# Patient Record
Sex: Female | Born: 2000 | Race: White | Hispanic: No | Marital: Single | Smoking: Never smoker
Health system: Southern US, Community
[De-identification: ages and names within clinical notes are randomized; demographics above are authoritative.]

## PROBLEM LIST (undated history)

## (undated) DIAGNOSIS — F99 Mental disorder, not otherwise specified: Secondary | ICD-10-CM

## (undated) DIAGNOSIS — Z9109 Other allergy status, other than to drugs and biological substances: Secondary | ICD-10-CM

## (undated) HISTORY — DX: Mental disorder, not otherwise specified: F99

---
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
## 2021-06-17 ENCOUNTER — Other Ambulatory Visit (HOSPITAL_COMMUNITY)
Admission: RE | Admit: 2021-06-17 | Discharge: 2021-06-17 | Disposition: A | Discharge status: Home / Self Care | Payer: BC Managed Care – PPO | Source: Ambulatory Visit | Attending: Family Medicine | Admitting: Family Medicine

## 2021-06-17 ENCOUNTER — Ambulatory Visit: Payer: BC Managed Care – PPO | Admitting: Family Medicine

## 2021-06-17 ENCOUNTER — Encounter: Payer: Self-pay | Admitting: Family Medicine

## 2021-06-17 VITALS — BP 116/75 | HR 88 | Wt 118.0 lb

## 2021-06-17 DIAGNOSIS — Z113 Encounter for screening for infections with a predominantly sexual mode of transmission: Secondary | ICD-10-CM

## 2021-06-17 DIAGNOSIS — Z30431 Encounter for routine checking of intrauterine contraceptive device: Secondary | ICD-10-CM | POA: Diagnosis not present

## 2021-06-18 LAB — CERVICOVAGINAL ANCILLARY ONLY
Chlamydia: NEGATIVE
Comment: NEGATIVE
Comment: NEGATIVE
Comment: NORMAL
Neisseria Gonorrhea: NEGATIVE
Trichomonas: NEGATIVE

## 2021-06-19 ENCOUNTER — Encounter: Payer: Self-pay | Admitting: Family Medicine

## 2021-06-19 NOTE — Progress Notes (Signed)
? ? ?  GYNECOLOGY CLINIC- IUD STRING CHECK PROGRESS NOTE ? ?History:  ?21 y.o. G0P0000 here today for today for IUD string check; Mirena was placed  2022. No complaints about the Mirena, no concerning side effects. Desires STI check ? ?The following portions of the patient's history were reviewed and updated as appropriate: allergies, current medications, past family history, past medical history, past social history, past surgical history and problem list. Last pap smear on NA-- at 35 ? ?Review of Systems:  ?Pertinent items are noted in HPI. ?  ?Objective:  ?Physical Exam ?Blood pressure 116/75, pulse 88, weight 118 lb (53.5 kg). ?Gen: NAD ?Abd: Soft, nontender and nondistended ?Pelvic: Normal appearing external genitalia; normal appearing vaginal mucosa and cervix.  IUD strings visualized, about 3 cm in length outside cervix.  ? ?Assessment & Plan:  ?Normal IUD check. ?Patient to keep IUD in place for five years; can come in for removal if she desires pregnancy within the next five years. ?Routine preventative health maintenance measures emphasized ?STI check today ? ?Federico Flake, MD  ?Faculty Practice  ?Center for [employer] ? ?

## 2021-09-11 ENCOUNTER — Ambulatory Visit
Admission: RE | Admit: 2021-09-11 | Discharge: 2021-09-11 | Disposition: A | Discharge status: Home / Self Care | Payer: BC Managed Care – PPO | Source: Ambulatory Visit | Attending: Urgent Care | Admitting: Urgent Care

## 2021-09-11 VITALS — BP 104/69 | HR 64 | Temp 98.4°F | Resp 16

## 2021-09-11 DIAGNOSIS — J069 Acute upper respiratory infection, unspecified: Secondary | ICD-10-CM | POA: Insufficient documentation

## 2021-09-11 DIAGNOSIS — J029 Acute pharyngitis, unspecified: Secondary | ICD-10-CM | POA: Insufficient documentation

## 2021-09-11 LAB — POCT RAPID STREP A (OFFICE): Rapid Strep A Screen: NEGATIVE

## 2021-09-11 NOTE — ED Triage Notes (Signed)
Pt presents with a cough, HA, and ST x 2 days.

## 2021-09-11 NOTE — Discharge Instructions (Addendum)
Your sore throat is likely related to a virus. Your strep test is negative. We will call with the results of the throat swab if it requires treatment with antibiotics. We have also swabbed you for covid and flu, and will call with your results once available. Please alternate tylenol with ibuprofen to help with the discomfort or fever. You may also try chloraseptic spray or cepacol lozenges. Monitor for fever >100.3, swollen lymph nodes or white spots on the back of the throat which may indicate a need to return to our clinic.

## 2021-09-11 NOTE — ED Provider Notes (Signed)
Angel Mathews    CSN: [id number] Arrival date & time: 09/11/21  0854      History   Chief Complaint Chief Complaint  Patient presents with   Sore Throat    With a cough, slight fever, and headaches - Entered by patient   Cough   Headache    HPI Angel Mathews is a 21 y.o. female.   Pleasant 21yo female presents today with complaint of a cough, sore throat and headache x 2 days (today is day 3). She also reports having hot and cold chills, but has not checked temp with a thermometer, although feels febrile. Cough is dry. Reports headache to frontal and occipital region, denies nuchal rigidity or lymphadenopathy. Denies sinus pain, congestion, rhinorrhea, ear pain. Recently returned from a trip to [place], is concerned she picked something up at her 9 hour layover at the airport. Does have decreased appetite, but denies n/v/d, abdominal pain or rash. She did take a home covid test 24 hours after symptom onset which was reportedly negative.   Sore Throat Associated symptoms include headaches. Pertinent negatives include no abdominal pain and no shortness of breath.  Cough Associated symptoms: chills, fever (subjective), headaches and sore throat   Associated symptoms: no diaphoresis, no ear pain, no rhinorrhea and no shortness of breath   Headache Associated symptoms: cough, fever (subjective) and sore throat   Associated symptoms: no abdominal pain, no congestion, no diarrhea, no ear pain, no nausea, no sinus pressure and no vomiting     Past Medical History:  Diagnosis Date   Environmental allergies    Mental disorder    depression and anxiety    There are no problems to display for this patient.   History reviewed. No pertinent surgical history.  OB History     Gravida  0   Para  0   Term  0   Preterm  0   AB  0   Living  0      SAB  0   IAB  0   Ectopic  0   Multiple  0   Live Births  0            Home Medications    Prior to  Admission medications   Medication Sig Start Date End Date Taking? Authorizing Provider  levonorgestrel (MIRENA) 20 MCG/DAY IUD 1 each by Intrauterine route once.    [provider]    Family History History reviewed. No pertinent family history.  Social History Social History   Tobacco Use   Smoking status: Never   Smokeless tobacco: Never  Vaping Use   Vaping Use: Never used  Substance Use Topics   Alcohol use: Never   Drug use: Never     Allergies   Patient has no known allergies.   Review of Systems Review of Systems  Constitutional:  Positive for appetite change, chills and fever (subjective). Negative for diaphoresis.  HENT:  Positive for sore throat. Negative for congestion, ear discharge, ear pain, rhinorrhea, sinus pressure and sneezing.   Respiratory:  Positive for cough. Negative for chest tightness and shortness of breath.   Gastrointestinal:  Negative for abdominal pain, diarrhea, nausea and vomiting.  Neurological:  Positive for headaches.  All other systems reviewed and are negative.    Physical Exam Triage Vital Signs ED Triage Vitals [09/11/21 0907]  Enc Vitals Group     BP 104/69     Pulse Rate 64     Resp 16  Temp 98.4 F (36.9 C)     Temp Source Oral     SpO2 99 %     Weight      Height      Head Circumference      Peak Flow      Pain Score      Pain Loc      Pain Edu?      Excl. in GC?    No data found.  Updated Vital Signs BP 104/69 (BP Location: Left Arm)   Pulse 64   Temp 98.4 F (36.9 C) (Oral)   Resp 16   SpO2 99%   Visual Acuity Right Eye Distance:   Left Eye Distance:   Bilateral Distance:    Right Eye Near:   Left Eye Near:    Bilateral Near:     Physical Exam Vitals and nursing note reviewed.  Constitutional:      General: She is not in acute distress.    Appearance: She is well-developed and normal weight. She is not ill-appearing, toxic-appearing or diaphoretic.  HENT:     Head: Normocephalic  and atraumatic.     Right Ear: Tympanic membrane and ear canal normal. No drainage, swelling or tenderness. No middle ear effusion. Tympanic membrane is not erythematous.     Left Ear: Tympanic membrane and ear canal normal. No drainage, swelling or tenderness.  No middle ear effusion. Tympanic membrane is not erythematous.     Nose: No congestion or rhinorrhea.     Mouth/Throat:     Mouth: Mucous membranes are moist. No oral lesions.     Pharynx: Oropharynx is clear. Uvula midline. Posterior oropharyngeal erythema (soft palate only, bilaterally) present. No pharyngeal swelling, oropharyngeal exudate or uvula swelling.     Tonsils: No tonsillar exudate or tonsillar abscesses.  Eyes:     Extraocular Movements:     Right eye: Normal extraocular motion.     Left eye: Normal extraocular motion.     Conjunctiva/sclera: Conjunctivae normal.     Pupils: Pupils are equal, round, and reactive to light.  Neck:     Thyroid: No thyromegaly.  Cardiovascular:     Rate and Rhythm: Normal rate and regular rhythm.     Heart sounds: Normal heart sounds. No murmur heard.    No friction rub. No gallop.  Pulmonary:     Effort: Pulmonary effort is normal. No respiratory distress.     Breath sounds: Normal breath sounds. No stridor. No wheezing, rhonchi or rales.  Chest:     Chest wall: No tenderness.  Abdominal:     Palpations: Abdomen is soft.  Musculoskeletal:     Cervical back: Normal range of motion and neck supple.  Lymphadenopathy:     Cervical: Cervical adenopathy (B submandibular) present.  Skin:    General: Skin is warm and dry.     Coloration: Skin is not pale.     Findings: No erythema or rash.  Neurological:     General: No focal deficit present.     Mental Status: She is alert and oriented to person, place, and time.  Psychiatric:        Mood and Affect: Mood normal.        Behavior: Behavior normal.      UC Treatments / Results  Labs (all labs ordered are listed, but only  abnormal results are displayed) Labs Reviewed  CULTURE, GROUP A STREP ([facility])  COVID-19, FLU A+B NAA  POCT RAPID STREP A (OFFICE)  EKG   Radiology No results found.  Procedures Procedures (including critical care time)  Medications Ordered in UC Medications - No data to display  Initial Impression / Assessment and Plan / UC Course  I have reviewed the triage vital signs and the nursing notes.  Pertinent labs & imaging results that were available during my care of the patient were reviewed by me and considered in my medical decision making (see chart for details).     Acute pharyngitis - rapid strep negative. Throat culture obtained. Supportive management with OTC care appropriate. Recommendations discussed Viral URI - covid/ flu testing performed given pt's recent international travel. Pt is healthy without comorbidities and would not be a candidate for anti-viral therapy if positive.    Final Clinical Impressions(s) / UC Diagnoses   Final diagnoses:  Acute pharyngitis, unspecified etiology  Viral upper respiratory infection     Discharge Instructions      Your sore throat is likely related to a virus. Your strep test is negative. We will call with the results of the throat swab if it requires treatment with antibiotics. We have also swabbed you for covid and flu, and will call with your results once available. Please alternate tylenol with ibuprofen to help with the discomfort or fever. You may also try chloraseptic spray or cepacol lozenges. Monitor for fever >100.3, swollen lymph nodes or white spots on the back of the throat which may indicate a need to return to our clinic.      ED Prescriptions   None    PDMP not reviewed this encounter.   Maretta Bees, [place] 09/11/21 [phone]

## 2021-09-12 LAB — COVID-19, FLU A+B NAA
Influenza A, NAA: NOT DETECTED
Influenza B, NAA: NOT DETECTED
SARS-CoV-2, NAA: NOT DETECTED

## 2021-09-12 LAB — CULTURE, GROUP A STREP (THRC)

## 2021-09-14 LAB — CULTURE, GROUP A STREP (THRC)

## 2022-04-12 ENCOUNTER — Ambulatory Visit
Admission: RE | Admit: 2022-04-12 | Discharge: 2022-04-12 | Disposition: A | Discharge status: Home / Self Care | Payer: BC Managed Care – PPO | Source: Ambulatory Visit | Attending: Nurse Practitioner | Admitting: Nurse Practitioner

## 2022-04-12 ENCOUNTER — Ambulatory Visit
Admission: RE | Admit: 2022-04-12 | Discharge: 2022-04-12 | Disposition: A | Discharge status: Home / Self Care | Payer: BC Managed Care – PPO | Attending: Nurse Practitioner | Admitting: Nurse Practitioner

## 2022-04-12 ENCOUNTER — Other Ambulatory Visit: Payer: Self-pay | Admitting: Nurse Practitioner

## 2022-04-12 DIAGNOSIS — M79641 Pain in right hand: Secondary | ICD-10-CM

## 2022-04-30 DIAGNOSIS — F329 Major depressive disorder, single episode, unspecified: Secondary | ICD-10-CM | POA: Insufficient documentation

## 2022-06-08 DIAGNOSIS — E559 Vitamin D deficiency, unspecified: Secondary | ICD-10-CM | POA: Insufficient documentation

## 2022-08-19 NOTE — Therapy (Deleted)
OUTPATIENT PHYSICAL THERAPY EVALUATION   Patient Name: Angel Mathews MRN: [id number] DOB:Jun 02, 2000, 22 y.o., female Today's Date: 08/19/2022  END OF SESSION:   Past Medical History:  Diagnosis Date   Environmental allergies    Mental disorder    depression and anxiety   No past surgical history on file. There are no problems to display for this patient.   PCP: Charlton Amor, MD  REFERRING PROVIDER: Mariana Arn, NP  REFERRING DIAG: low back pain  Rationale for Evaluation and Treatment: Rehabilitation  THERAPY DIAG:  No diagnosis found.  ONSET DATE: ***  SUBJECTIVE:                                                                                                                                                                                           SUBJECTIVE STATEMENT: ***  PERTINENT HISTORY:  Patient is a 22 y.o. female who presents to outpatient physical therapy with a referral for medical diagnosis low back pain. This patient's chief complaints consist of ***, leading to the following functional deficits: ***. Relevant past medical history and comorbidities include ***.  Patient denies hx of {redflags:27294}   PAIN:  Are you having pain? Yes: NPRS scale: Current: ***/10,  Best: ***/10, Worst: ***/10. Pain location: *** Pain description: *** Aggravating factors: *** Relieving factors: ***   FUNCTIONAL LIMITATIONS: ***  LEISURE: ***  PRECAUTIONS: {Therapy precautions:24002}  WEIGHT BEARING RESTRICTIONS: {Yes ***/No:24003}  FALLS:  Has patient fallen in last 6 months? {fallsyesno:27318}  LIVING ENVIRONMENT: Lives with: {OPRC lives with:25569::"lives with their family"} Lives in: {Lives in:25570} Stairs: {opstairs:27293} Has following equipment at home: {Assistive devices:23999}  OCCUPATION: ***  PLOF: {PLOF:24004}  PATIENT GOALS: ***  NEXT MD VISIT: ***   OBJECTIVE  DIAGNOSTIC FINDINGS:  No imaging in chart  SELF- REPORTED  FUNCTION FOTO score: ***/100 (lumbar spine questionnaire)  OBSERVATION/INSPECTION Posture Posture (seated): forward head, rounded shoulders, slumped in sitting.  Posture (standing): *** Posture correction: *** Anthropometrics Tremor: none Body composition: *** Muscle bulk: *** Skin: The incision sites appear to be healing well with no excessive redness, warmth, drainage or signs of infection present.  *** Edema: *** Functional Mobility Bed mobility: *** Transfers: *** Gait: grossly WFL for household and short community ambulation. More detailed gait analysis deferred to later date as needed. *** Stairs: ***  SPINE MOTION  LUMBAR SPINE AROM *Indicates pain Flexion: *** Extension: *** Side Flexion:   R ***  L *** Rotation:  R *** L *** Side glide:  R *** L ***   NEUROLOGICAL  Upper Motor Neuron Screen Babinski, Hoffman's and Clonus (ankle) negative bilaterally.  Dermatomes C2-T1 appears  equal and intact to light touch except the following: *** L2-S2 appears equal and intact to light touch except the following: *** Deep Tendon Reflexes R/L  ***+/***+ Biceps brachii reflex (C5, C6) ***+/***+ Brachioradialis reflex (C6) ***+/***+ Triceps brachii reflex (C7) ***+/***+ Quadriceps reflex (L4) ***+/***+ Achilles reflex (S1)  SPINE MOTION  CERVICAL SPINE AROM *Indicates pain Flexion: *** Extension: *** Side Flexion:   R ***  L *** Rotation:  R *** L ***   PERIPHERAL JOINT MOTION (in degrees)  ACTIVE RANGE OF MOTION (AROM) *Indicates pain Date Date Date  Joint/Motion R/L R/L R/L  Shoulder     Flexion / / /  Extension / / /  Abduction  / / /  External rotation / / /  Internal rotation / / /  Elbow     Flexion  / / /  Extension  / / /  Wrist     Flexion / / /  Extension  / / /  Radial deviation / / /  Ulnar deviation / / /  Pronation / / /  Supination / / /  Hip     Flexion / / /  Extension  / / /  Abduction / / /  Adduction / / /   External rotation / / /  Internal rotation  / / /  Knee     Extension / / /  Flexoin / / /  Ankle/Foot     Dorsiflexion (knee ext) / / /  Dorsiflexion (knee flex) / / /  Plantarflexion / / /  Everison / / /  Inversion / / /  Great toe extension / / /  Great toe flexion / / /  Comments:   PASSIVE RANGE OF MOTION (PROM) *Indicates pain Date Date Date  Joint/Motion R/L R/L R/L  Shoulder     Flexion / / /  Extension / / /  Abduction  / / /  External rotation / / /  Internal rotation / / /  Elbow     Flexion  / / /  Extension  / / /  Wrist     Flexion / / /  Extension  / / /  Radial deviation / / /  Ulnar deviation / / /  Pronation / / /  Supination / / /  Hip     Flexion  / / /  Extension  / / /  Abduction / / /  Adduction / / /  External rotation / / /  Internal rotation  / / /  Knee     Extension / / /  Flexion / / /  Ankle/Foot     Dorsiflexion (knee ext) / / /  Dorsiflexion (knee flex) / / /  Plantarflexion / / /  Everison / / /  Inversion / / /  Great toe extension / / /  Great toe flexion / / /  Comments:   MUSCLE PERFORMANCE (MMT):  *Indicates pain Date Date Date  Joint/Motion R/L R/L R/L  Shoulder     Flexion / / /  Abduction (C5) / / /  External rotation / / /  Internal rotation / / /  Extension / / /  Elbow     Flexion (C6) / / /  Extension (C7) / / /  Wrist     Flexion (C7) / / /  Extension (C6) / / /  Radial deviation / / /  Ulnar deviation (C8) / / /  Pronation / / /  Supination / / /  Hand     Thumb extension (C8) / / /  Finger abduction (T1) / / /  Grip (C8) / / /  Hip     Flexion (L1, L2) / / /  Extension (knee ext) / / /  Extension (knee flex) / / /  Abduction / / /  Adduction / / /  External rotation / / /  Internal rotation  / / /  Knee     Extension (L3) / / /  Flexion (S2) / / /  Ankle/Foot     Dorsiflexion (L4) / / /  Great toe extension (L5) / / /  Eversion (S1) / / /  Plantarflexion (S1) / / /   Inversion / / /  Pronation / / /  Great toe flexion / / /  Comments:   SPECIAL TESTS:  .Neurodynamictests .NeurodynamicUE .NeurodynamicLE .CspineInstability .CSPINESPECIALTESTS .SHOULDERSPECIALTESTCLUSTERS .HIPSPECIALTESTS .SIJSPECIALTESTS   SHOULDER SPECIAL TESTS RTC, Impingement, Anterior Instability (macrotrauma), Labral Tear: Painful arc test: R = ***, L = ***. Drop arm test: R = ***, L = ***. Hawkins-Kennedy test: R = ***, L = ***. Infraspinatus test: R = ***, L = ***. Apprehension test: R = ***, L = ***. Relocation test: R = ***, L = ***. Active compression test: R = ***, L = ***.  ACCESSORY MOTION: ***  PALPATION: ***  SUSTAINED POSITIONS TESTING:  ***  REPEATED MOTIONS TESTING: ***  FUNCTIONAL/BALANCE TESTS: Five Time Sit to Stand (5TSTS): *** seconds Functional Gait Assessment (FGA): ***/30 (see details above) Ten meter walking trial ( ): *** m/s Six Minute Walk Test ( ): *** feet Timed Up and Go (TUG): *** seconds   Dynamic Gait Index: ***/24 BERG Balance Scale: ***/56 Tinetti/POMA: ***/28 Timed Up and GO: *** seconds (average of 3 trials) Trial 1: *** Trial 2: *** Trial 3: *** Romberg test: -Narrow stance, eyes open: *** seconds -Narrow stance, eyes closed: *** seconds Sharpened Romberg test: -Tandem stance, eyes open: *** seconds -Tandem stance, eyes closed: *** seconds  Narrow stance, firm surface, eyes open: *** seconds Narrow stance, firm surface, eyes closed: *** seconds Narrow stance, compliant surface, eyes open: *** seconds Narrow stance, compliant surface, eyes closed: *** seconds Single leg stance, firm surface, eyes open: R= *** seconds, L= *** seconds Single leg stance, compliant surface, eyes open: R= *** seconds, L= *** seconds Gait speed: *** m/s Functional reach test: *** inches   TODAY'S TREATMENT:    PATIENT EDUCATION:  Education details: *** Person educated: {Person educated:25204} Education method:  {Education Method:25205} Education comprehension: {Education Comprehension:25206}  HOME EXERCISE PROGRAM: ***  ASSESSMENT:  CLINICAL IMPRESSION: Patient is a 22 y.o. female referred to outpatient physical therapy with a medical diagnosis of low back pain who presents with signs and symptoms consistent with ***. Patient presents with significant *** impairments that are limiting ability to complete *** without difficulty. Patient will benefit from skilled physical therapy intervention to address current body structure impairments and activity limitations to improve function and work towards goals set in current POC in order to return to prior level of function or maximal functional improvement.    OBJECTIVE IMPAIRMENTS: {opptimpairments:25111}.   ACTIVITY LIMITATIONS: {activitylimitations:27494}  PARTICIPATION LIMITATIONS: {participationrestrictions:25113}  PERSONAL FACTORS: {Personal factors:25162} are also affecting patient's functional outcome.   REHAB POTENTIAL: {rehabpotential:25112}  CLINICAL DECISION MAKING: {clinical decision making:25114}  EVALUATION COMPLEXITY: {Evaluation complexity:25115}   GOALS: Goals reviewed with patient? {yes/no:20286}  SHORT TERM GOALS: Target date: 09/02/2022  Patient will be independent with initial home exercise program for self-management of symptoms. Baseline: {HEPbaseline4:27310} (08/19/22); Goal status: INITIAL   LONG TERM GOALS: Target date: 11/11/2022  Patient will be independent with a long-term home exercise program for self-management of symptoms.  Baseline: {HEPbaseline4:27310} (08/19/22); Goal status: INITIAL  2.  Patient will demonstrate improved FOTO to equal or greater than *** by visit #*** to demonstrate improvement in overall condition and self-reported functional ability.  Baseline: *** (08/19/22); Goal status: INITIAL  3.  *** Baseline: *** (08/19/22); Goal status: INITIAL  4.  *** Baseline: *** (08/19/22); Goal  status: INITIAL  5.  Patient will complete community, work and/or recreational activities without limitation due to current condition.  Baseline: *** (08/19/22); Goal status: INITIAL  6.  *** Baseline: *** Goal status: INITIAL   PLAN:  PT FREQUENCY: {rehab frequency:25116}  PT DURATION: {rehab duration:25117}  PLANNED INTERVENTIONS: {rehab planned interventions:25118::"Therapeutic exercises","Therapeutic activity","Neuromuscular re-education","Balance training","Gait training","Patient/Family education","Self Care","Joint mobilization"}.  PLAN FOR NEXT SESSION: ***   Cira Rue, PT, DPT 08/19/2022, 12:54 PM   [facility] [address] [place], [place] [address] P: [phone] I F: [phone]

## 2022-08-26 ENCOUNTER — Ambulatory Visit: Payer: Self-pay | Attending: Nurse Practitioner | Admitting: Physical Therapy

## 2022-08-31 ENCOUNTER — Encounter: Payer: Self-pay | Admitting: Physical Therapy

## 2022-09-02 ENCOUNTER — Encounter: Payer: Self-pay | Admitting: Physical Therapy

## 2022-09-07 ENCOUNTER — Encounter: Payer: Self-pay | Admitting: Physical Therapy

## 2022-09-10 ENCOUNTER — Ambulatory Visit
Admission: RE | Admit: 2022-09-10 | Discharge: 2022-09-10 | Disposition: A | Discharge status: Home / Self Care | Payer: Self-pay | Source: Ambulatory Visit | Attending: Nurse Practitioner | Admitting: Nurse Practitioner

## 2022-09-10 ENCOUNTER — Ambulatory Visit
Admission: RE | Admit: 2022-09-10 | Discharge: 2022-09-10 | Disposition: A | Discharge status: Home / Self Care | Payer: Self-pay | Attending: Nurse Practitioner | Admitting: Nurse Practitioner

## 2022-09-10 ENCOUNTER — Other Ambulatory Visit: Payer: Self-pay | Admitting: Nurse Practitioner

## 2022-09-10 DIAGNOSIS — M545 Low back pain, unspecified: Secondary | ICD-10-CM | POA: Insufficient documentation

## 2022-09-14 ENCOUNTER — Encounter: Payer: Self-pay | Admitting: Physical Therapy

## 2022-09-16 ENCOUNTER — Encounter: Payer: Self-pay | Admitting: Physical Therapy

## 2022-09-21 ENCOUNTER — Encounter: Payer: Self-pay | Admitting: Physical Therapy

## 2022-09-23 ENCOUNTER — Encounter: Payer: Self-pay | Admitting: Physical Therapy

## 2022-09-28 ENCOUNTER — Encounter: Payer: Self-pay | Admitting: Physical Therapy

## 2022-09-30 ENCOUNTER — Encounter: Payer: Self-pay | Admitting: Physical Therapy

## 2022-10-05 ENCOUNTER — Encounter: Payer: Self-pay | Admitting: Physical Therapy

## 2022-10-07 ENCOUNTER — Encounter: Payer: Self-pay | Admitting: Physical Therapy

## 2022-10-12 ENCOUNTER — Encounter: Payer: Self-pay | Admitting: Physical Therapy

## 2022-10-14 ENCOUNTER — Encounter: Payer: Self-pay | Admitting: Physical Therapy

## 2022-10-19 ENCOUNTER — Encounter: Payer: Self-pay | Admitting: Physical Therapy

## 2022-10-21 ENCOUNTER — Encounter: Payer: Self-pay | Admitting: Physical Therapy

## 2022-10-26 ENCOUNTER — Encounter: Payer: Self-pay | Admitting: Physical Therapy

## 2022-10-28 ENCOUNTER — Encounter: Payer: Self-pay | Admitting: Physical Therapy

## 2022-11-02 ENCOUNTER — Encounter: Payer: Self-pay | Admitting: Physical Therapy

## 2022-11-04 ENCOUNTER — Encounter: Payer: Self-pay | Admitting: Physical Therapy

## 2022-11-09 ENCOUNTER — Encounter: Payer: Self-pay | Admitting: Physical Therapy

## 2022-11-11 ENCOUNTER — Encounter: Payer: Self-pay | Admitting: Physical Therapy

## 2023-01-17 DIAGNOSIS — F419 Anxiety disorder, unspecified: Secondary | ICD-10-CM | POA: Insufficient documentation

## 2023-03-22 ENCOUNTER — Other Ambulatory Visit: Payer: Self-pay | Admitting: Nurse Practitioner

## 2023-03-22 DIAGNOSIS — R102 Pelvic and perineal pain: Secondary | ICD-10-CM

## 2023-03-28 ENCOUNTER — Ambulatory Visit
Admission: RE | Admit: 2023-03-28 | Discharge: 2023-03-28 | Disposition: A | Discharge status: Home / Self Care | Payer: Self-pay | Source: Ambulatory Visit | Attending: Nurse Practitioner | Admitting: Nurse Practitioner

## 2023-03-28 DIAGNOSIS — R102 Pelvic and perineal pain: Secondary | ICD-10-CM | POA: Insufficient documentation

## 2023-03-30 ENCOUNTER — Ambulatory Visit (HOSPITAL_BASED_OUTPATIENT_CLINIC_OR_DEPARTMENT_OTHER): Payer: Self-pay | Admitting: Student

## 2023-03-30 ENCOUNTER — Encounter (HOSPITAL_BASED_OUTPATIENT_CLINIC_OR_DEPARTMENT_OTHER): Payer: Self-pay

## 2023-04-01 ENCOUNTER — Ambulatory Visit: Payer: Self-pay | Admitting: Family Medicine

## 2023-04-01 ENCOUNTER — Other Ambulatory Visit: Payer: Self-pay

## 2023-04-28 IMAGING — US US PELVIS COMPLETE
1 series · 15 of 25 positions shown · non-contrast
Comparison: Prior ultrasound from 08/04/2016.

CLINICAL DATA: Initial evaluation for left greater than right
pelvic pain.

EXAM:
TRANSABDOMINAL ULTRASOUND OF PELVIS
TECHNIQUE: Transabdominal ultrasound examination of the pelvis was performed
including evaluation of the uterus, ovaries, adnexal regions, and
pelvic cul-de-sac.

[Series 1: us pelvis complete · 70 acquisitions, 15 frames shown]
[im 1/70]
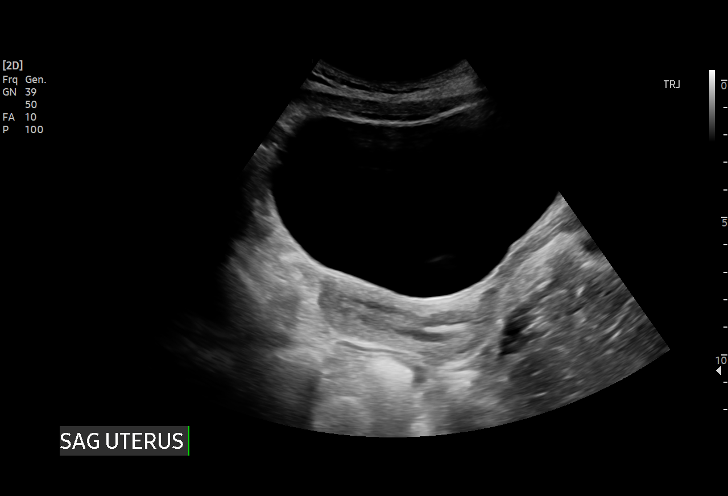
[im 6/70]
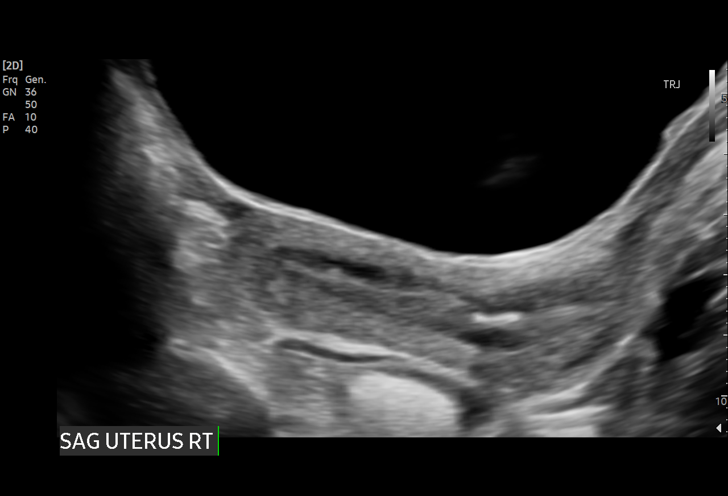
[im 12/70]
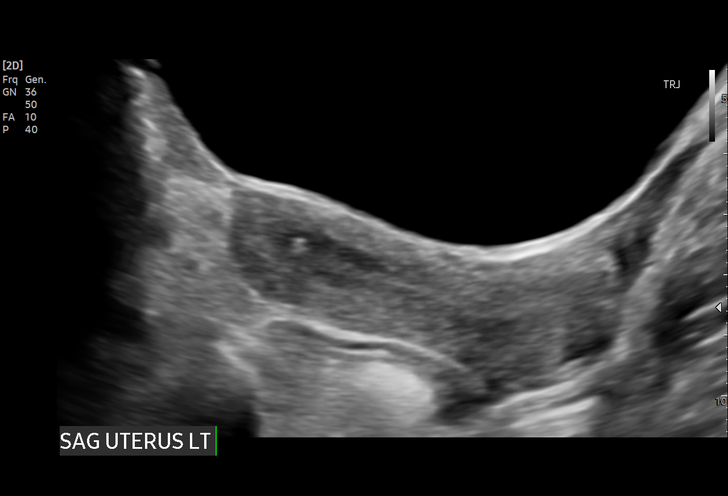
[im 15/70]
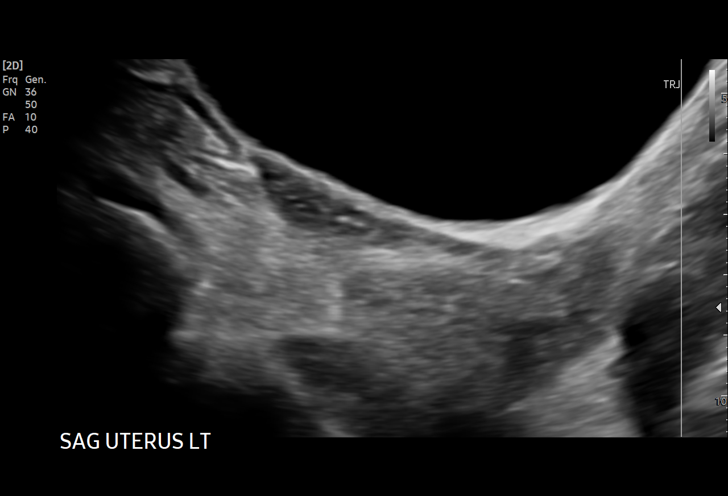
[im 21/70]
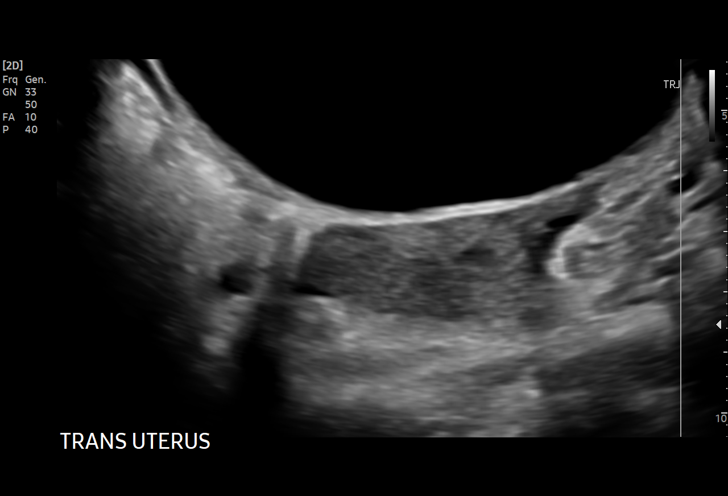
[im 26/70]
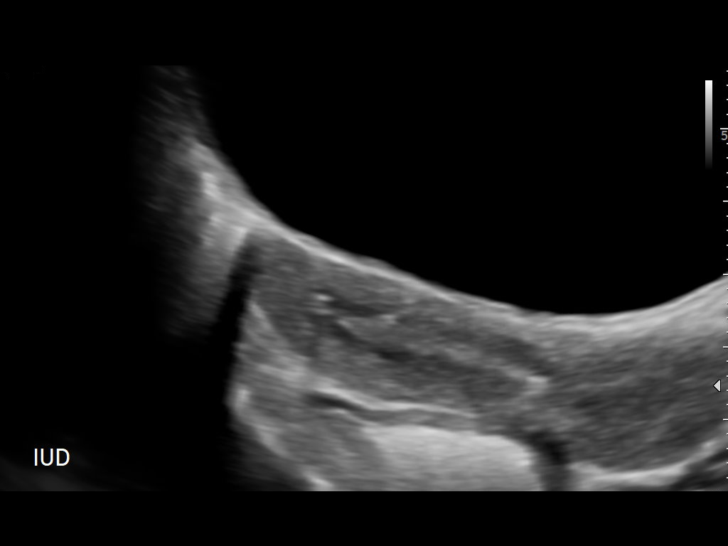
[im 29/70]
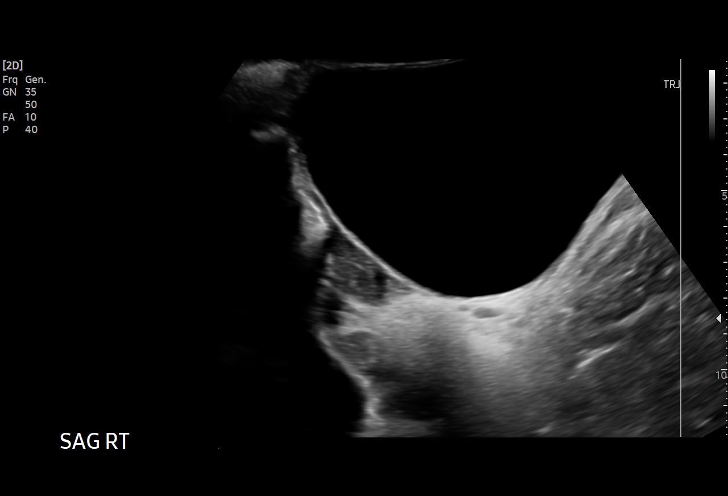
[im 35/70]
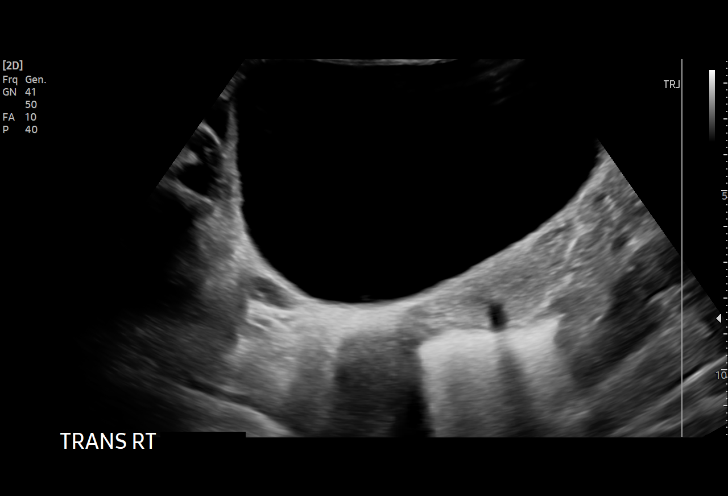
[im 41/70]
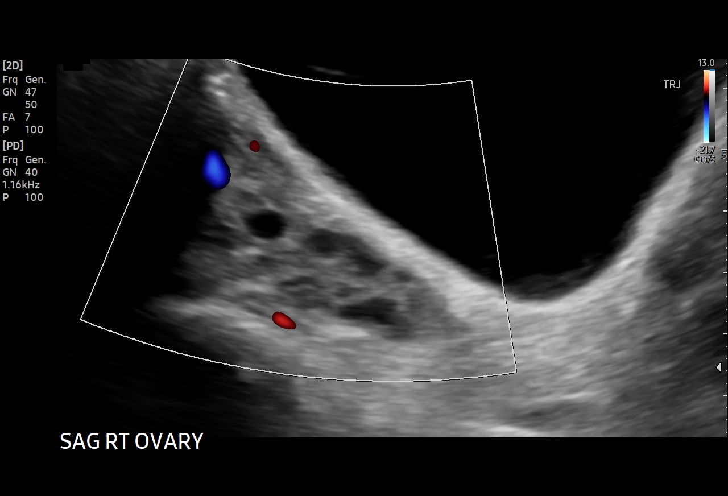
[im 44/70]
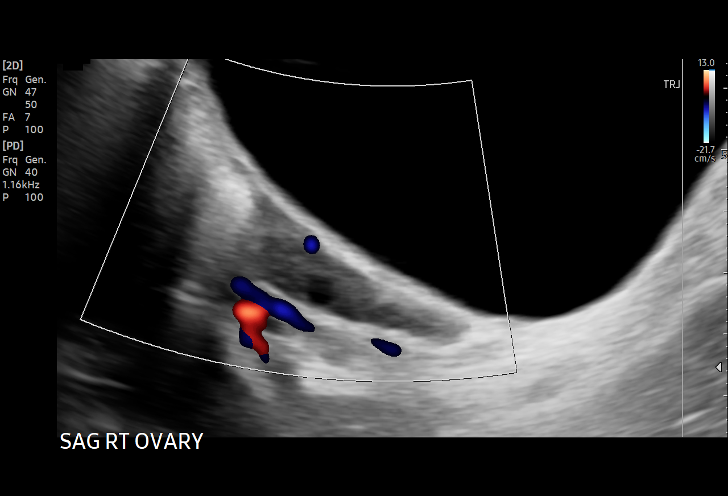
[im 49/70]
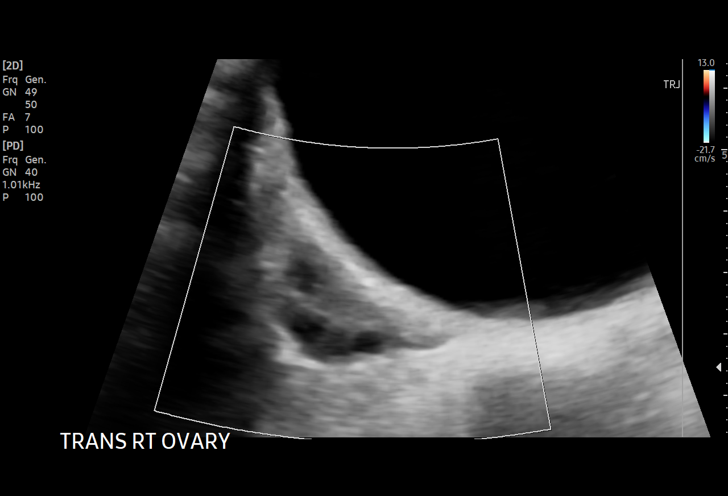
[im 55/70]
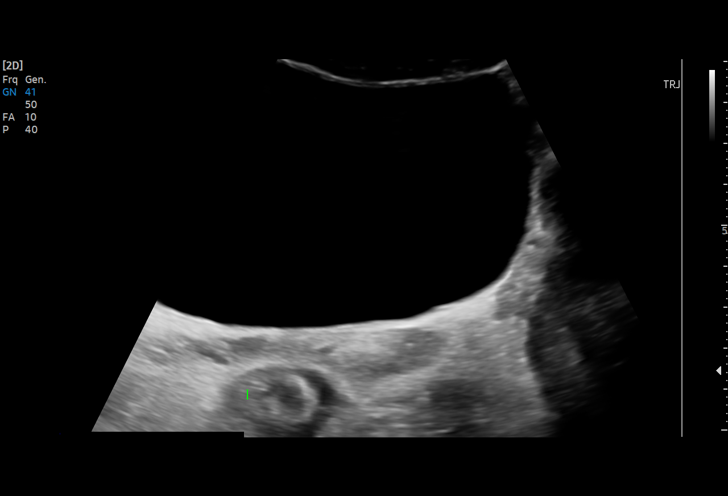
[im 58/70]
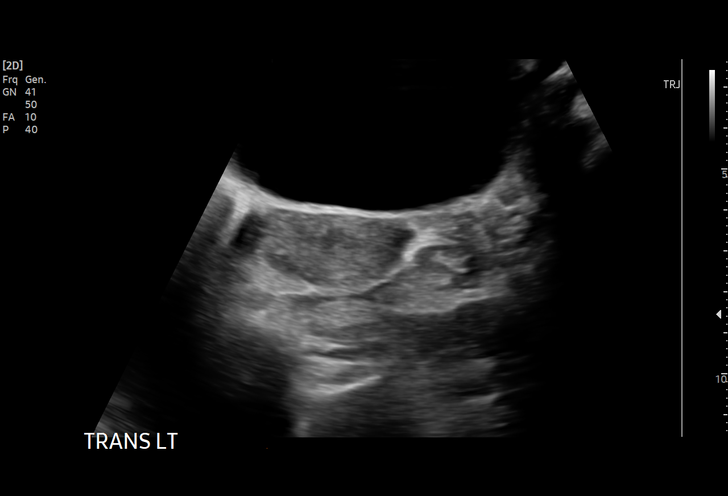
[im 64/70]
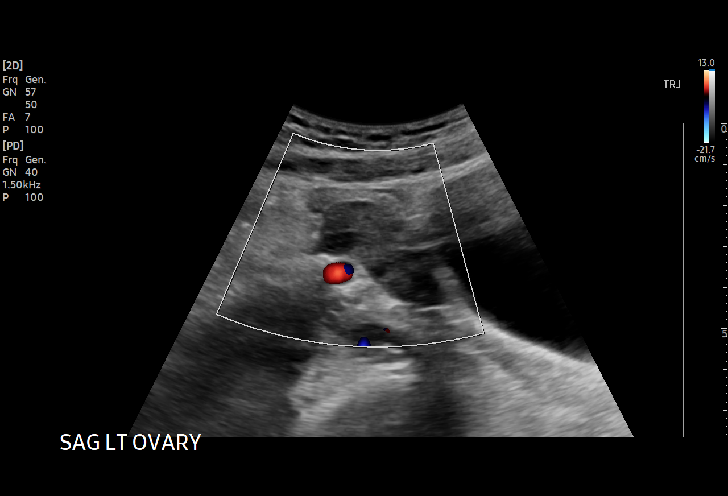
[im 70/70]
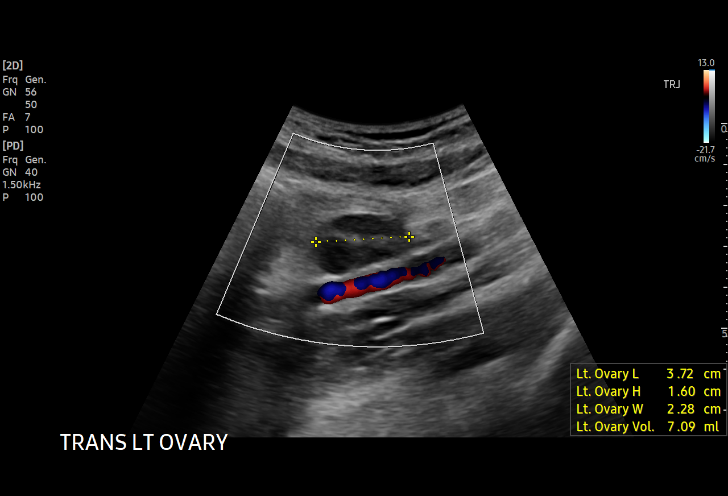

[15 of 25 positions shown; findings below may reference images not displayed]

FINDINGS: Uterus

Measurements: 6.1 x 2.0 x 4.3 cm = volume: 27.2 mL. No fibroids or
other mass visualized.

Endometrium

Thickness: 2.9 mm. No focal abnormality visualized. IUD in
appropriate position within the endometrial cavity.

Right ovary

Measurements: 4.3 x 1.5 x 2.1 cm = volume: 7.0 mL. Normal
appearance/no adnexal mass.

Left ovary

Measurements: 3.7 x 1.6 x 2.3 cm = volume: 7.1 mL. Normal
appearance/no adnexal mass.

Other findings:  No abnormal free fluid.
IMPRESSION: 1. Normal pelvic ultrasound. No ovarian cyst or other finding to
explain patient's symptoms identified.
2. IUD in appropriate position within the endometrial cavity.

## 2023-05-19 ENCOUNTER — Ambulatory Visit: Payer: Self-pay

## 2023-07-05 ENCOUNTER — Other Ambulatory Visit (HOSPITAL_COMMUNITY)
Admission: RE | Admit: 2023-07-05 | Discharge: 2023-07-05 | Disposition: A | Discharge status: Home / Self Care | Source: Ambulatory Visit | Attending: Obstetrics & Gynecology | Admitting: Obstetrics & Gynecology

## 2023-07-05 ENCOUNTER — Ambulatory Visit: Payer: Self-pay | Admitting: Obstetrics & Gynecology

## 2023-07-05 ENCOUNTER — Encounter: Payer: Self-pay | Admitting: Obstetrics & Gynecology

## 2023-07-05 VITALS — BP 107/73 | HR 77 | Ht 67.0 in | Wt 124.2 lb

## 2023-07-05 DIAGNOSIS — Z113 Encounter for screening for infections with a predominantly sexual mode of transmission: Secondary | ICD-10-CM | POA: Diagnosis not present

## 2023-07-05 DIAGNOSIS — Z30432 Encounter for removal of intrauterine contraceptive device: Secondary | ICD-10-CM | POA: Diagnosis not present

## 2023-07-05 DIAGNOSIS — Z01419 Encounter for gynecological examination (general) (routine) without abnormal findings: Secondary | ICD-10-CM

## 2023-07-05 NOTE — Progress Notes (Signed)
 GYNECOLOGY ANNUAL PREVENTATIVE CARE ENCOUNTER NOTE  History:     Angel Mathews is a 23 y.o. G0P0000 female here for a routine annual gynecologic exam.  Current complaints: desires removal of her Liletta  that was placed 04/02/2020.  Not issues, just wants this removed "to give my body a break from hormones". Not sexually active now, plans to use condoms if she becomes sexually active for contraception and STI prevention.  Also wants pap smear and STI screen today.   Denies abnormal vaginal bleeding, discharge, pelvic pain, problems with intercourse or other gynecologic concerns.    Gynecologic History No LMP recorded. (Menstrual status: IUD). Contraception: condoms after IUD removal Reports that she had received HPV vaccine series  Obstetric History OB History  Gravida Para Term Preterm AB Living  0 0 0 0 0 0  SAB IAB Ectopic Multiple Live Births  0 0 0 0 0    Past Medical History:  Diagnosis Date   Environmental allergies    Mental disorder    depression and anxiety    History reviewed. No pertinent surgical history.  Current Outpatient Medications on File Prior to Visit  Medication Sig Dispense Refill   Cyanocobalamin (VITAMIN B-12 PO)      Ergocalciferol (VITAMIN D2 PO)      IRON, FERROUS SULFATE, PO      sertraline (ZOLOFT) 50 MG tablet Take 50 mg by mouth daily.     No current facility-administered medications on file prior to visit.    No Known Allergies  Social History:  reports that she has never smoked. She has never used smokeless tobacco. She reports that she does not drink alcohol and does not use drugs.  History reviewed. No pertinent family history.  The following portions of the patient's history were reviewed and updated as appropriate: allergies, current medications, past family history, past medical history, past social history, past surgical history and problem list.  Review of Systems Pertinent items noted in HPI and remainder of comprehensive  ROS otherwise negative.  Physical Exam:  BP 107/73   Pulse 77   Ht 5\' 7"  (1.702 m)   Wt 124 lb 3.2 oz (56.3 kg)   BMI 19.45 kg/m  CONSTITUTIONAL: Well-developed, well-nourished female in no acute distress.  HENT:  Normocephalic, atraumatic, External right and left ear normal.  EYES: Conjunctivae and EOM are normal. Pupils are equal, round, and reactive to light. No scleral icterus.  NECK: Normal range of motion, supple, no masses observed. SKIN: Skin is warm and dry. No rash noted. Not diaphoretic. No erythema. No pallor. MUSCULOSKELETAL: Normal range of motion. No tenderness.  No cyanosis, clubbing, or edema. NEUROLOGIC: Alert and oriented to person, place, and time. Normal muscle tone coordination.  PSYCHIATRIC: Normal mood and affect. Normal behavior. Normal judgment and thought content. CARDIOVASCULAR: Normal heart rate noted, regular rhythm RESPIRATORY: Clear to auscultation bilaterally. Effort and breath sounds normal, no problems with respiration noted. BREASTS: Symmetric in size. No masses, tenderness, skin changes, nipple drainage, or lymphadenopathy bilaterally. Performed in the presence of a chaperone. ABDOMEN: Soft, no distention noted.  No tenderness, rebound or guarding.  PELVIC: Normal appearing external genitalia and urethral meatus; normal appearing vaginal mucosa and cervix.  No abnormal vaginal discharge noted.  Liletta  strings noted. Pap smear obtained.  Normal uterine size, no other palpable masses, no uterine or adnexal tenderness.  Performed in the presence of a chaperone.  IUD Removal Procedure Note Patient identified, informed consent performed, consent signed.   Chaperone present.  Patient was placed in the dorsal lithotomy position, normal external genitalia was noted. During her pelvic exam, the cervix was visualized, no lesions, no abnormal discharge.  After pap smear was obtained, the strings of the IUD were grasped and pulled using ring forceps. The Liletta  IUD  was removed in its entirety.   Assessment and Plan:     1. Encounter for IUD removal Liletta  successfully removed, no complications. Plans to use condoms for contraception and STI prevention.   2. Routine screening for STI (sexually transmitted infection) STI screen done, will follow up results and manage accordingly. - Cytology - PAP - RPR+HBsAg+HCVAb+HIV  3. Well woman exam with routine gynecological exam (Primary) - Cytology - PAP Will follow up results of pap smear and manage accordingly. Normal breast examination today, she was advised to perform periodic self breast examinations.  Routine preventative health maintenance measures emphasized. Please refer to After Visit Summary for other counseling recommendations.      Lenoard Rad, MD, FACOG Obstetrician & Gynecologist, [facility] for [employer], [facility]

## 2023-07-06 LAB — CYTOLOGY - PAP
Chlamydia: NEGATIVE
Comment: NEGATIVE
Comment: NEGATIVE
Comment: NEGATIVE
Comment: NORMAL
Diagnosis: NEGATIVE
High risk HPV: NEGATIVE
Neisseria Gonorrhea: NEGATIVE
Trichomonas: NEGATIVE

## 2023-07-06 LAB — RPR+HBSAG+HCVAB+...
HIV Screen 4th Generation wRfx: NONREACTIVE
Hep C Virus Ab: NONREACTIVE
Hepatitis B Surface Ag: NEGATIVE
RPR Ser Ql: NONREACTIVE

## 2023-09-02 ENCOUNTER — Other Ambulatory Visit: Payer: Self-pay | Admitting: Nurse Practitioner

## 2023-09-02 ENCOUNTER — Ambulatory Visit
Admission: RE | Admit: 2023-09-02 | Discharge: 2023-09-02 | Disposition: A | Discharge status: Home / Self Care | Payer: Worker's Compensation | Source: Ambulatory Visit | Attending: Nurse Practitioner | Admitting: Nurse Practitioner

## 2023-09-02 ENCOUNTER — Ambulatory Visit
Admission: RE | Admit: 2023-09-02 | Discharge: 2023-09-02 | Disposition: A | Discharge status: Home / Self Care | Payer: Worker's Compensation | Attending: Nurse Practitioner | Admitting: Nurse Practitioner

## 2023-09-02 DIAGNOSIS — M79671 Pain in right foot: Secondary | ICD-10-CM | POA: Insufficient documentation

## 2023-09-02 DIAGNOSIS — Y990 Civilian activity done for income or pay: Secondary | ICD-10-CM

## 2023-09-02 DIAGNOSIS — M79673 Pain in unspecified foot: Secondary | ICD-10-CM | POA: Diagnosis present

## 2023-11-15 ENCOUNTER — Emergency Department

## 2023-11-15 ENCOUNTER — Other Ambulatory Visit: Payer: Self-pay

## 2023-11-15 ENCOUNTER — Emergency Department
Admission: EM | Admit: 2023-11-15 | Discharge: 2023-11-15 | Disposition: A | Discharge status: Home / Self Care | Attending: Emergency Medicine | Admitting: Emergency Medicine

## 2023-11-15 DIAGNOSIS — K529 Noninfective gastroenteritis and colitis, unspecified: Secondary | ICD-10-CM | POA: Diagnosis not present

## 2023-11-15 DIAGNOSIS — N83202 Unspecified ovarian cyst, left side: Secondary | ICD-10-CM | POA: Diagnosis not present

## 2023-11-15 DIAGNOSIS — R1032 Left lower quadrant pain: Secondary | ICD-10-CM | POA: Diagnosis not present

## 2023-11-15 LAB — URINALYSIS, ROUTINE W REFLEX MICROSCOPIC
Bilirubin Urine: NEGATIVE
Glucose, UA: NEGATIVE mg/dL
Hgb urine dipstick: NEGATIVE
Ketones, ur: 20 mg/dL — AB
Leukocytes,Ua: NEGATIVE
Nitrite: NEGATIVE
Protein, ur: NEGATIVE mg/dL
Specific Gravity, Urine: 1.025 (ref 1.005–1.030)
pH: 5 (ref 5.0–8.0)

## 2023-11-15 LAB — COMPREHENSIVE METABOLIC PANEL WITH GFR
ALT: 13 U/L (ref 0–44)
AST: 15 U/L (ref 15–41)
Albumin: 4.4 g/dL (ref 3.5–5.0)
Alkaline Phosphatase: 48 U/L (ref 38–126)
Anion gap: 9 (ref 5–15)
BUN: 12 mg/dL (ref 6–20)
CO2: 24 mmol/L (ref 22–32)
Calcium: 8.9 mg/dL (ref 8.9–10.3)
Chloride: 105 mmol/L (ref 98–111)
Creatinine, Ser: 0.61 mg/dL (ref 0.44–1.00)
GFR, Estimated: >60 mL/min (ref >60)
Glucose, Bld: 112 mg/dL — ABNORMAL HIGH (ref 70–99)
Potassium: 3.6 mmol/L (ref 3.5–5.1)
Sodium: 138 mmol/L (ref 135–145)
Total Bilirubin: 0.6 mg/dL (ref 0.0–1.2)
Total Protein: 6.9 g/dL (ref 6.5–8.1)

## 2023-11-15 LAB — CBC
HCT: 39.6 % (ref 36.0–46.0)
Hemoglobin: 13.4 g/dL (ref 12.0–15.0)
MCH: 30.6 pg (ref 26.0–34.0)
MCHC: 33.8 g/dL (ref 30.0–36.0)
MCV: 90.4 fL (ref 80.0–100.0)
Platelets: 230 K/uL (ref 150–400)
RBC: 4.38 MIL/uL (ref 3.87–5.11)
RDW: 11.4 % — ABNORMAL LOW (ref 11.5–15.5)
WBC: 5.4 K/uL (ref 4.0–10.5)
nRBC: 0 % (ref 0.0–0.2)

## 2023-11-15 LAB — LIPASE, BLOOD: Lipase: 29 U/L (ref 11–51)

## 2023-11-15 LAB — POC URINE PREG, ED: Preg Test, Ur: NEGATIVE

## 2023-11-15 MED ORDER — ONDANSETRON HCL 4 MG/2ML IJ SOLN
4.0000 mg | Freq: Once | INTRAMUSCULAR | Status: AC
  Administered 2023-11-15: 4 mg via INTRAVENOUS
  Filled 2023-11-15: qty 2

## 2023-11-15 MED ORDER — AMOXICILLIN-POT CLAVULANATE 875-125 MG PO TABS
1.0000 | ORAL_TABLET | Freq: Once | ORAL | Status: AC
  Administered 2023-11-15: 1 via ORAL
  Filled 2023-11-15: qty 1

## 2023-11-15 MED ORDER — AMOXICILLIN-POT CLAVULANATE 875-125 MG PO TABS: 1.0000 | ORAL_TABLET | Freq: Two times a day (BID) | ORAL | 0 refills | Status: AC

## 2023-11-15 MED ORDER — SODIUM CHLORIDE 0.9 % IV BOLUS
1000.0000 mL | Freq: Once | INTRAVENOUS | Status: AC
  Administered 2023-11-15: 1000 mL via INTRAVENOUS

## 2023-11-15 MED ORDER — MORPHINE SULFATE (PF) 2 MG/ML IV SOLN
2.0000 mg | Freq: Once | INTRAVENOUS | Status: AC
  Administered 2023-11-15: 2 mg via INTRAVENOUS
  Filled 2023-11-15: qty 1

## 2023-11-15 MED ORDER — IOHEXOL 300 MG/ML  SOLN
100.0000 mL | Freq: Once | INTRAMUSCULAR | Status: AC | PRN
  Administered 2023-11-15: 75 mL via INTRAVENOUS

## 2023-11-15 NOTE — ED Triage Notes (Signed)
 Pt to ED for sharp LLQ abdominal pain and L lower back pain that started this AM. Pt is crying. Denies dysuria and hx ovarian cysts. LMP 2 weeks ago. Skin dry, respirations unlabored. No vomiting, diarrhea or constipation. Endorses nausea.

## 2023-11-15 NOTE — ED Provider Notes (Signed)
 Angel Mathews Provider Note    Event Date/Time   First MD Initiated Contact with Patient 11/15/23 [phone]     (approximate)   History   Abdominal Pain and Back Pain   HPI  Angel Mathews is a 23 y.o. female with history of anxiety, MDD, presenting with left lower quadrant abdominal pain and back pain.  Started the morning.  States the pain is constant.  She has nausea but no vomiting or diarrhea.  Bowel movements normal, denies any dysuria urinary symptoms, no vaginal bleeding or discharge, denies history of ovarian cysts.  No prior abdominal surgeries.  The pain started left lower quadrant and moves around to her lower back.  On independent review, she was seen by OB/GYN in April for preventative care visit, had an IUD removed, had STI screening done that was negative.  Normal Pap smear.     Physical Exam   Triage Vital Signs: ED Triage Vitals  Encounter Vitals Group     BP 11/15/23 0834 116/73     Girls Systolic BP Percentile --      Girls Diastolic BP Percentile --      Boys Systolic BP Percentile --      Boys Diastolic BP Percentile --      Pulse Rate 11/15/23 0834 70     Resp 11/15/23 0834 17     Temp 11/15/23 0834 98.2 F (36.8 C)     Temp Source 11/15/23 0834 Oral     SpO2 11/15/23 0834 100 %     Weight 11/15/23 0836 125 lb (56.7 kg)     Height 11/15/23 0836 5' 7 (1.702 m)     Head Circumference --      Peak Flow --      Pain Score 11/15/23 0843 8     Pain Loc --      Pain Education --      Exclude from Growth Chart --     Most recent vital signs: Vitals:   11/15/23 0834 11/15/23 1246  BP: 116/73 117/73  Pulse: 70 68  Resp: 17 17  Temp: 98.2 F (36.8 C) 98.1 F (36.7 C)  SpO2: 100% 100%     General: Awake, no distress.  CV:  Good peripheral perfusion.  Resp:  Normal effort.  Abd:  No distention.  Soft, tender to the left lower quadrant Other:  No CVA tenderness bilaterally, no flank tenderness bilaterally,  nontoxic-appearing, ambulatory.   ED Results / Procedures / Treatments   Labs (all labs ordered are listed, but only abnormal results are displayed) Labs Reviewed  COMPREHENSIVE METABOLIC PANEL WITH GFR - Abnormal; Notable for the following components:      Result Value   Glucose, Bld 112 (*)    All other components within normal limits  CBC - Abnormal; Notable for the following components:   RDW 11.4 (*)    All other components within normal limits  URINALYSIS, ROUTINE W REFLEX MICROSCOPIC - Abnormal; Notable for the following components:   Color, Urine YELLOW (*)    APPearance CLOUDY (*)    Ketones, ur 20 (*)    All other components within normal limits  LIPASE, BLOOD  POC URINE PREG, ED      RADIOLOGY On my independent interpretation, CT showed a left ovarian cyst   PROCEDURES:  Critical Care performed: No  Procedures   MEDICATIONS ORDERED IN ED: Medications  sodium chloride  0.9 % bolus 1,000 mL (1,000 mLs Intravenous New Bag/Given 11/15/23  1005)  morphine  (PF) 2 MG/ML injection 2 mg (2 mg Intravenous Given 11/15/23 1008)  ondansetron  (ZOFRAN ) injection 4 mg (4 mg Intravenous Given 11/15/23 1008)  iohexol  (OMNIPAQUE ) 300 MG/ML solution 100 mL (75 mLs Intravenous Contrast Given 11/15/23 1021)  amoxicillin -clavulanate (AUGMENTIN ) 875-125 MG per tablet 1 tablet (1 tablet Oral Given 11/15/23 1158)     IMPRESSION / MDM / ASSESSMENT AND PLAN / ED COURSE  I reviewed the triage vital signs and the nursing notes.                              Differential diagnosis includes, but is not limited to, nephrolithiasis, UTI, pyelonephritis, ovarian cyst, colitis, diverticulitis, viral illness, gastroenteritis.  Will get labs, UA, IV fluids, IV pain meds.  CT abdomen pelvis.  Patient's presentation is most consistent with acute presentation with potential threat to life or bodily function.  Independent interpretation of labs and imaging below.  On reassessment patient is feeling  significantly better, discussed with her about imaging and lab results including incidental findings also discussed the ultrasound findings of the left complex ovarian cyst, discussed getting MRI here but patient would like to be followed for this outpatient and get her MRI done outpatient.  Does have an OB/GYN that she can follow-up with for the cyst.  Also discussed the colitis, will give her 10-day course of antibiotics, first dose given in the emergency department.  Will give her a number to call for GI to schedule follow-up as well.  Will also give her some primary care follow-up.  Considered but no indication for inpatient admission at this time, she safe for outpatient management.  Will discharge with strict return precautions.  Shared decision making to patient and mom and they are agreeable with this plan.  Discharge.    Clinical Course as of 11/15/23 1252  Tue Nov 15, 2023  0905 Preg Test, Ur: Negative [TT]  0909 Independent review of labs, lipase is not elevated, no leukocytosis, electrolytes not severely deranged, LFTs are normal. [TT]  0914 Urinalysis, Routine w reflex microscopic -Urine, Clean Catch(!) Negative [TT]  1125 CT ABDOMEN PELVIS W CONTRAST IMPRESSION: 1. Mildly thickened and poorly distended ascending, transverse and proximal to mid descending colon which may represent mild colitis. 2. 3.8 cm left adnexal cyst, likely ovarian in origin. Correlation with nonemergent pelvic ultrasound is recommended.   [TT]  1232 US  PELVIC COMPLETE W TRANSVAGINAL AND TORSION R/O IMPRESSION: 1. Large, complex left ovarian cyst. While this may be hemorrhagic in nature, further evaluation with MRI is recommended for improved characterization and to further exclude the presence of an underlying neoplastic process.   [TT]  1232 On reassessment patient is feeling a lot better, discussed with her about labs and imaging results. [TT]    Clinical Course User Index [TT] Waymond Lorelle Cummins, MD      FINAL CLINICAL IMPRESSION(S) / ED DIAGNOSES   Final diagnoses:  Left lower quadrant abdominal pain  Colitis  Cyst of left ovary     Rx / DC Orders   ED Discharge Orders          Ordered    Ambulatory Referral to Primary Care (Establish Care)        11/15/23 1250    amoxicillin -clavulanate (AUGMENTIN ) 875-125 MG tablet  2 times daily        11/15/23 1250             Note:  This document  was prepared using Conservation officer, historic buildings and may include unintentional dictation errors.    Waymond Lorelle Cummins, MD 11/15/23 [phone]

## 2023-11-15 NOTE — Discharge Instructions (Signed)
 Please go to your OB/GYN to schedule follow-up for the ovarian cyst noted on the ultrasound as well as your CAT scan.  You will need an MRI.   Please take the antibiotics as prescribed for your colitis.  I have also given you a number to call for gastroenterology to schedule follow-up.  I have also put in a primary care doctor referral for you.  You can take Tylenol  or ibuprofen as needed for the pain.

## 2023-11-22 ENCOUNTER — Encounter: Payer: Self-pay | Admitting: Obstetrics and Gynecology

## 2023-11-22 ENCOUNTER — Ambulatory Visit: Admitting: Obstetrics and Gynecology

## 2023-11-22 VITALS — BP 96/63 | HR 66 | Ht 67.0 in | Wt 125.8 lb

## 2023-11-22 DIAGNOSIS — N83292 Other ovarian cyst, left side: Secondary | ICD-10-CM | POA: Diagnosis not present

## 2023-11-22 DIAGNOSIS — Z23 Encounter for immunization: Secondary | ICD-10-CM | POA: Diagnosis not present

## 2023-11-22 NOTE — Progress Notes (Signed)
 Obstetrics and Gynecology New Patient Evaluation  Appointment Date: 11/22/2023  OBGYN Clinic: [facility]   Primary Care Provider: Patient, No Pcp Per  Referring Provider: [facility] ED  Chief Complaint:  Chief Complaint  Patient presents with   Follow-up    Follow up from Tuesday at the ED for a cyst on ovary and discuss MRI     History of Present Illness: Angel Mathews is a 23 y.o.  [id number] (Patient's last menstrual period was 10/31/2023 (exact date).), seen for the above chief complaint. Her past medical history is significant for nothing  Patient went to [facility] ED on 9/9 for sudden onset LLQ pelvic pain. Pain started that day and no prior h/o similar s/s. CT showed ?colitis and left ovarian cyst that was shown to be a 3cm complex LO cyst on u/s ?hemorrhagic cyst; labs negative  Pt continues on augmentin  from ED and s/s improving.    Review of Systems: Pertinent items are noted in HPI.    Patient Active Problem List   Diagnosis Date Noted   Complex cyst of left ovary 11/22/2023   Anxiety 01/17/2023   Vitamin D deficiency 06/08/2022   Major depressive disorder, single episode, unspecified 04/30/2022   Anemia 03/28/2016    Past Medical History:  Past Medical History:  Diagnosis Date   Environmental allergies    Mental disorder    depression and anxiety   Past Surgical History:  No past surgical history on file. Past Obstetrical History:  OB History  Gravida Para Term Preterm AB Living  0 0 0 0 0 0  SAB IAB Ectopic Multiple Live Births  0 0 0 0 0   Past Gynecological History: As per HPI. Periods: qmonth, regular History of Pap Smear(s): Yes.   Last pap 2025, which was negative and negative STD screening She is currently using no method for contraception.   Social History:  Social History   Socioeconomic History   Marital status: Single    Spouse name: Not on file   Number of children: Not on file   Years of education: Not on file    Highest education level: Not on file  Occupational History   Not on file  Tobacco Use   Smoking status: Never   Smokeless tobacco: Never  Vaping Use   Vaping status: Never Used  Substance and Sexual Activity   Alcohol use: Never   Drug use: Never   Sexual activity: Not on file  Other Topics Concern   Not on file  Social History Narrative   Not on file   Social Drivers of Health   Financial Resource Strain: Not on file  Food Insecurity: Not on file  Transportation Needs: Not on file  Physical Activity: Not on file  Stress: Not on file  Social Connections: Not on file  Intimate Partner Violence: Not on file   Family History: No family history on file.  Medications Zoei J. Delafuente had no medications administered during this visit. Current Outpatient Medications  Medication Sig Dispense Refill   amoxicillin -clavulanate (AUGMENTIN ) 875-125 MG tablet Take 1 tablet by mouth 2 (two) times daily for 10 days. 20 tablet 0   Cyanocobalamin (VITAMIN B-12 PO)      Ergocalciferol (VITAMIN D2 PO)      IRON, FERROUS SULFATE, PO      sertraline (ZOLOFT) 50 MG tablet Take 50 mg by mouth daily. (Patient not taking: Reported on 11/22/2023)     No current facility-administered medications for this visit.  Allergies Patient has no known allergies.  Physical Exam:  BP 96/63 (BP Location: Left Arm, Patient Position: Sitting, Cuff Size: Normal)   Pulse 66   Ht 5' 7 (1.702 m)   Wt 125 lb 12.8 oz (57.1 kg)   LMP 10/31/2023 (Exact Date)   BMI 19.70 kg/m  Body mass index is 19.7 kg/m.  General appearance: Well nourished, well developed female in no acute distress.  Respiratory:  Normal respiratory effort Abdomen: pt points to LLQ pelvis medial and lower to the left ASIS for where pain was Neuro/Psych:  Normal mood and affect.   Laboratory: as per HPI  Radiology:  Narrative & Impression  CLINICAL DATA:  Left lower quadrant pain.   EXAM: TRANSABDOMINAL AND TRANSVAGINAL  ULTRASOUND OF PELVIS   DOPPLER ULTRASOUND OF OVARIES   TECHNIQUE: Both transabdominal and transvaginal ultrasound examinations of the pelvis were performed. Transabdominal technique was performed for global imaging of the pelvis including uterus, ovaries, adnexal regions, and pelvic cul-de-sac.   It was necessary to proceed with endovaginal exam following the transabdominal exam to visualize the endometrium and bilateral ovaries. Color and duplex Doppler ultrasound was utilized to evaluate blood flow to the ovaries.   COMPARISON:  March 28, 2023   FINDINGS: Uterus   Measurements: 7.4 cm x 2.9 cm x 4.2 cm = volume: 47.3 mL. No fibroids or other mass visualized.   Endometrium   Thickness: 7.4 mm.  No focal abnormality visualized.   Right ovary   Measurements: 1.6 cm x 2.9 cm x 1.8 cm = volume: 4.2 mL. Normal appearance/no adnexal mass.   Left ovary   Measurements: 3.6 cm x 2.4 cm x 3.0 cm = volume: 16.7 mL. A 2.8 cm x 2.9 cm x 3.2 cm complex left ovarian cyst is seen.   Pulsed Doppler evaluation of both ovaries demonstrates normal low-resistance arterial and venous waveforms.   Other findings   No abnormal free fluid.   IMPRESSION: 1. Large, complex left ovarian cyst. While this may be hemorrhagic in nature, further evaluation with MRI is recommended for improved characterization and to further exclude the presence of an underlying neoplastic process.     Electronically Signed   By: Suzen Dials M.D.   On: 11/15/2023 12:24   Narrative & Impression  CLINICAL DATA:  Left lower quadrant abdominal pain.   EXAM: CT ABDOMEN AND PELVIS WITH CONTRAST   TECHNIQUE: Multidetector CT imaging of the abdomen and pelvis was performed using the standard protocol following bolus administration of intravenous contrast.   RADIATION DOSE REDUCTION: This exam was performed according to the departmental dose-optimization program which includes automated exposure  control, adjustment of the mA and/or kV according to patient size and/or use of iterative reconstruction technique.   CONTRAST:  75mL OMNIPAQUE  IOHEXOL  300 MG/ML  SOLN   COMPARISON:  None Available.   FINDINGS: Lower chest: No acute abnormality.   Hepatobiliary: No focal liver abnormality is seen. No gallstones, gallbladder wall thickening, or biliary dilatation.   Pancreas: Unremarkable. No pancreatic ductal dilatation or surrounding inflammatory changes.   Spleen: Normal in size without focal abnormality.   Adrenals/Urinary Tract: Adrenal glands are unremarkable. Kidneys are normal, without renal calculi, focal lesion, or hydronephrosis. Bladder is unremarkable.   Stomach/Bowel: Stomach is within normal limits. The appendix is not clearly identified. No evidence of bowel dilatation. Mildly thickened and poorly distended ascending, transverse and proximal to mid descending colon is seen.   Vascular/Lymphatic: No significant vascular findings are present. No enlarged abdominal or pelvic  lymph nodes.   Reproductive: A 3.8 cm left adnexal cyst is seen. The uterus and right adnexa are unremarkable.   Other: No abdominal wall hernia or abnormality. No abdominopelvic ascites.   Musculoskeletal: No acute or significant osseous findings.   IMPRESSION: 1. Mildly thickened and poorly distended ascending, transverse and proximal to mid descending colon which may represent mild colitis. 2. 3.8 cm left adnexal cyst, likely ovarian in origin. Correlation with nonemergent pelvic ultrasound is recommended.     Electronically Signed   By: Suzen Dials M.D.   On: 11/15/2023 11:21   Assessment: patient improving  Plan:  1. Flu vaccine need (Primary) - Flu vaccine trivalent PF, 6mos and older(Flulaval,Afluria,Fluarix,Fluzone)  2. Complex cyst of left ovary Images reviewed and it appears to be a hemorrhagic cyst which goes along with LMP and s/s. Recommend repeat u/s October  or November this year. Patient declines hormonal therapy, which is reasonable. I told her to call us  on the first day of her period in October or November and we'll schedule an u/s for a week after the first day of her period - US  Transvaginal Non-OB; Future  Orders Placed This Encounter  Procedures   US  Transvaginal Non-OB   Flu vaccine trivalent PF, 6mos and older(Flulaval,Afluria,Fluarix,Fluzone)    RTC PRN  Bebe Izell Raddle MD Attending [facility])

## 2023-11-24 DIAGNOSIS — R197 Diarrhea, unspecified: Secondary | ICD-10-CM | POA: Diagnosis not present

## 2023-11-24 DIAGNOSIS — R1084 Generalized abdominal pain: Secondary | ICD-10-CM | POA: Diagnosis not present

## 2023-12-01 DIAGNOSIS — R197 Diarrhea, unspecified: Secondary | ICD-10-CM | POA: Diagnosis not present

## 2023-12-01 DIAGNOSIS — R1084 Generalized abdominal pain: Secondary | ICD-10-CM | POA: Diagnosis not present

## 2023-12-20 ENCOUNTER — Ambulatory Visit

## 2023-12-20 DIAGNOSIS — R109 Unspecified abdominal pain: Secondary | ICD-10-CM | POA: Diagnosis not present

## 2023-12-20 DIAGNOSIS — R1084 Generalized abdominal pain: Secondary | ICD-10-CM | POA: Diagnosis not present

## 2023-12-20 DIAGNOSIS — R933 Abnormal findings on diagnostic imaging of other parts of digestive tract: Secondary | ICD-10-CM | POA: Diagnosis not present

## 2023-12-20 DIAGNOSIS — R935 Abnormal findings on diagnostic imaging of other abdominal regions, including retroperitoneum: Secondary | ICD-10-CM | POA: Diagnosis not present
